# Patient Record
Sex: Female | Born: 1960 | Race: Black or African American | Hispanic: No | Marital: Single | Smoking: Never smoker
Health system: Southern US, Community
[De-identification: ages and names within clinical notes are randomized; demographics above are authoritative.]

## PROBLEM LIST (undated history)

## (undated) HISTORY — PX: APPENDECTOMY: SHX54

---

## 2015-12-22 ENCOUNTER — Encounter (HOSPITAL_BASED_OUTPATIENT_CLINIC_OR_DEPARTMENT_OTHER): Payer: Self-pay | Admitting: Emergency Medicine

## 2015-12-22 ENCOUNTER — Emergency Department (HOSPITAL_BASED_OUTPATIENT_CLINIC_OR_DEPARTMENT_OTHER): Payer: BLUE CROSS/BLUE SHIELD

## 2015-12-22 ENCOUNTER — Emergency Department (HOSPITAL_BASED_OUTPATIENT_CLINIC_OR_DEPARTMENT_OTHER)
Admission: EM | Admit: 2015-12-22 | Discharge: 2015-12-22 | Disposition: A | Discharge status: Home / Self Care | Payer: BLUE CROSS/BLUE SHIELD | Attending: Emergency Medicine | Admitting: Emergency Medicine

## 2015-12-22 DIAGNOSIS — M85472 Solitary bone cyst, left ankle and foot: Secondary | ICD-10-CM | POA: Insufficient documentation

## 2015-12-22 DIAGNOSIS — M79672 Pain in left foot: Secondary | ICD-10-CM | POA: Diagnosis present

## 2015-12-22 DIAGNOSIS — M8560 Other cyst of bone, unspecified site: Secondary | ICD-10-CM

## 2015-12-22 MED ORDER — NAPROXEN 500 MG PO TABS: 500.0000 mg | ORAL_TABLET | Freq: Two times a day (BID) | ORAL | Status: DC

## 2015-12-22 MED ORDER — TRAMADOL HCL 50 MG PO TABS: 50.0000 mg | ORAL_TABLET | Freq: Four times a day (QID) | ORAL | Status: DC | PRN

## 2015-12-22 NOTE — Discharge Instructions (Signed)
Please read and follow all provided instructions.  Your diagnoses today include:  1. Left foot pain   2. Bone cyst     Tests performed today include:  An x-ray of the affected area - does NOT show any broken bones, shows a bone cyst in the navicular bone of your foot  Vital signs. See below for your results today.   Medications prescribed:   Naproxen - anti-inflammatory pain medication  Do not exceed 500mg  naproxen every 12 hours, take with food  You have been prescribed an anti-inflammatory medication or NSAID. Take with food. Take smallest effective dose for the shortest duration needed for your pain. Stop taking if you experience stomach pain or vomiting.    Tramadol - narcotic-like pain medication  DO NOT drive or perform any activities that require you to be awake and alert because this medicine can make you drowsy.   Take any prescribed medications only as directed.  Home care instructions:   Follow any educational materials contained in this packet  Follow R.I.C.E. Protocol:  R - rest your injury   I  - use ice on injury without applying directly to skin  C - compress injury with bandage or splint  E - elevate the injury as much as possible  Follow-up instructions: Please follow-up with the provided orthopedic physician (bone specialist) if you continue to have significant pain in 1 week. In this case you may have a more severe injury that requires further care.   Return instructions:   Please return if your toes or feet are numb or tingling, appear gray or blue, or you have severe pain (also elevate the leg and loosen splint or wrap if you were given one)  Please return to the Emergency Department if you experience worsening symptoms.   Please return if you have any other emergent concerns.  Additional Information:  Your vital signs today were: BP 121/77 mmHg   Pulse 73   Temp(Src) 98.6 F (37 C) (Oral)   Resp 18   Ht 5\' 6"  (1.676 m)   Wt 53.524 kg    BMI 19.05 kg/m2   SpO2 100% If your blood pressure (BP) was elevated above 135/85 this visit, please have this repeated by your doctor within one month. -------------- If prescribed crutches for your injury: use crutches with non-weight bearing for the first few days. Then, you may walk as the pain allows, or as instructed. Start gradually with weight bearing on the affected side. Once you can walk pain free, then try jogging. When you can run forwards, then you can try moving side-to-side. If you cannot walk without crutches in one week, you need a re-check. --------------

## 2015-12-22 NOTE — ED Provider Notes (Signed)
CSN: [id number]     Arrival date & time 12/22/15  1706 History   First MD Initiated Contact with Patient 12/22/15 1741     Chief Complaint  Patient presents with  . Foot Pain     (Consider location/radiation/quality/duration/timing/severity/associated sxs/prior Treatment) HPI Comments: Patient presents with complaint of intermittent left foot pain for the past several months, more persistent and severe and constant over the past 3 days. Patient states that she dropped ice pack on her foot 6 months ago and treated this with rice treatment. No new injuries. It is difficult for her to walk and she needs to walk on her heel. She is up a lot at work. She has taken Tylenol without much relief. No ankle or leg pain. Onset acute. Nothing makes symptoms better.  Patient is a 55 y.o. female presenting with lower extremity pain. The history is provided by the patient.  Foot Pain Associated symptoms include arthralgias. Pertinent negatives include no joint swelling, neck pain, numbness or weakness.    History reviewed. No pertinent past medical history. Past Surgical History  Procedure Laterality Date  . Appendectomy     No family history on file. Social History  Substance Use Topics  . Smoking status: Never Smoker   . Smokeless tobacco: None  . Alcohol Use: 1.8 oz/week    3 Cans of beer per week   OB History    No data available     Review of Systems  Constitutional: Negative for activity change.  Musculoskeletal: Positive for arthralgias. Negative for joint swelling and neck pain.  Skin: Negative for wound.  Neurological: Negative for weakness and numbness.      Allergies  Penicillins  Home Medications   Prior to Admission medications   Medication Sig Start Date End Date Taking? Authorizing Provider  naproxen (NAPROSYN) 500 MG tablet Take 1 tablet (500 mg total) by mouth 2 (two) times daily. 12/22/15   Renne CriglerJoshua Fenna Semel, PA-C  traMADol (ULTRAM) 50 MG tablet Take 1 tablet (50 mg  total) by mouth every 6 (six) hours as needed. 12/22/15   Renne CriglerJoshua Ayden Hardwick, PA-C   BP 121/77 mmHg  Pulse 73  Temp(Src) 98.6 F (37 C) (Oral)  Resp 18  Ht 5\' 6"  (1.676 m)  Wt 53.524 kg  BMI 19.05 kg/m2  SpO2 100%  Physical Exam  Constitutional: She appears well-developed and well-nourished.  HENT:  Head: Normocephalic and atraumatic.  Eyes: Pupils are equal, round, and reactive to light.  Neck: Normal range of motion. Neck supple.  Cardiovascular: Exam reveals no decreased pulses.   Pulses:      Dorsalis pedis pulses are 2+ on the right side, and 2+ on the left side.  Musculoskeletal: She exhibits tenderness. She exhibits no edema.       Left knee: Normal.       Left ankle: Normal. No tenderness.       Left lower leg: Normal.       Left foot: There is tenderness and bony tenderness. There is normal range of motion.       Feet:  Neurological: She is alert. No sensory deficit.  Motor, sensation, and vascular distal to the injury is fully intact.   Skin: Skin is warm and dry.  Psychiatric: She has a normal mood and affect.  Nursing note and vitals reviewed.   ED Course  Procedures (including critical care time)  Imaging Review Dg Foot Complete Left  12/22/2015  CLINICAL DATA:  Heavy object dropped on the top of the  foot 6 months ago. Pain in the foot over the last 3 days. EXAM: LEFT FOOT - COMPLETE 3+ VIEW COMPARISON:  None. FINDINGS: Bony demineralization. Lisfranc joint alignment normal. Hypodense lesion along the plantar - medial navicular appears to extend to the cortical surface on the lateral projection, potentially a geode or erosion. IMPRESSION: 1. Diffuse bony demineralization. 2. Lucency along the inferior -medial margin of the navicular, 0.7 by 0.8 by 0.5 cm, probably a geode or erosion. This could be further characterized in worked up with MRI if clinically warranted. Electronically Signed   By: Gaylyn Rong M.D.   On: 12/22/2015 18:00   I have personally reviewed  and evaluated these images and lab results as part of my medical decision-making.  6:22 PM Patient seen and examined. Informed of x-ray results. Will provide with crutches, Ace wrap, NSAIDs, tramadol. Sports medicine referral given.  Vital signs reviewed and are as follows: BP 121/77 mmHg  Pulse 73  Temp(Src) 98.6 F (37 C) (Oral)  Resp 18  Ht  (1.676 m)  Wt 53.524 kg  BMI 19.05 kg/m2  SpO2 100%  Patient was counseled on RICE protocol and told to rest injury, use ice for no longer than 15 minutes every hour, compress the area, and elevate above the level of their heart as much as possible to reduce swelling. Questions answered. Patient verbalized understanding.     MDM   Final diagnoses:  Left foot pain  Bone cyst   Patients with left foot x-ray showing navicular geode or erosion, corresponding to the area of pain. Lower leg and foot are neurovascularly intact.   Renne Crigler, PA-C 12/22/15 1823  Linwood Dibbles, MD 12/22/15 [phone]

## 2015-12-22 NOTE — ED Notes (Signed)
Dropped heavy object on left foot 6 months ago but was never x-rayed.  For the past 2 months it has been hurting more intermittently and for the pat 3 days it has gotten intense.

## 2015-12-22 NOTE — ED Notes (Signed)
Pt teaching provided on medications that may cause drowsiness. Pt instructed not to drive or operate heavy machinery while taking the prescribed medication. Pt verbalized understanding.   

## 2015-12-23 ENCOUNTER — Ambulatory Visit (INDEPENDENT_AMBULATORY_CARE_PROVIDER_SITE_OTHER): Payer: BLUE CROSS/BLUE SHIELD | Admitting: Family Medicine

## 2015-12-23 ENCOUNTER — Encounter: Payer: Self-pay | Admitting: Family Medicine

## 2015-12-23 VITALS — BP 101/69 | HR 64 | Ht 66.0 in | Wt 120.0 lb

## 2015-12-23 DIAGNOSIS — M79672 Pain in left foot: Secondary | ICD-10-CM

## 2015-12-23 DIAGNOSIS — M25572 Pain in left ankle and joints of left foot: Secondary | ICD-10-CM | POA: Diagnosis not present

## 2015-12-23 MED ORDER — HYDROCODONE-ACETAMINOPHEN 5-325 MG PO TABS: 1.0000 | ORAL_TABLET | Freq: Four times a day (QID) | ORAL | Status: AC | PRN

## 2015-12-23 MED FILL — HYDROCODON-APAP 5-325: 5-325 | 7 days supply | Qty: 30 | Fill #0

## 2015-12-23 NOTE — Patient Instructions (Signed)
We will go ahead with an MRI to assess for an occult navicular fracture of your foot. Try not to put weight on this in the meantime. ACE wrap, icing, elevation, use crutches. Norco as needed for severe pain (no driving on this).

## 2015-12-24 DIAGNOSIS — M79672 Pain in left foot: Secondary | ICD-10-CM | POA: Insufficient documentation

## 2015-12-24 NOTE — Assessment & Plan Note (Signed)
following acute injury 6 months ago.  Independently reviewed radiographs and no evidence fracture.  She has a well circumscribed bony cyst in navicular bone.  I'm concerned given her inability to ambulate, swelling, focal tenderness that she may have an occult fracture and need to assess that this is a simple cyst.  Will go ahead with MRI.  In meantime ace wrap, icing, elevation, crutches, norco as needed.

## 2015-12-24 NOTE — Progress Notes (Signed)
PCP: No PCP Per Patient  Subjective:   HPI: Patient is a 55 y.o. female here for left foot pain.  Patient reports she's had 6 months of dorsomedial left foot pain. Started after she dropped an ice pack on top of her foot there. Seemed to improve then a month later worsened again. Would wax and wane. Then past 3 days has had increased swelling, severe pain in same area. Unable to bear weight. No redness, fever. Pain is throbbing and burning. Has been soakin in warm water. Using crutches. Pain level 9/10.  No past medical history on file.  Current Outpatient Prescriptions on File Prior to Visit  Medication Sig Dispense Refill  . naproxen (NAPROSYN) 500 MG tablet Take 1 tablet (500 mg total) by mouth 2 (two) times daily. 20 tablet 0   No current facility-administered medications on file prior to visit.    Past Surgical History  Procedure Laterality Date  . Appendectomy      Allergies  Allergen Reactions  . Penicillins Rash    Hives, swelling     Social History   Social History  . Marital Status: Single    Spouse Name: N/A  . Number of Children: N/A  . Years of Education: N/A   Occupational History  . Not on file.   Social History Main Topics  . Smoking status: Never Smoker   . Smokeless tobacco: Not on file  . Alcohol Use: 1.8 oz/week    3 Cans of beer per week  . Drug Use: No  . Sexual Activity: Yes    Birth Control/ Protection: None   Other Topics Concern  . Not on file   Social History Narrative    No family history on file.  BP 101/69 mmHg  Pulse 64  Ht 5\' 6"  (1.676 m)  Wt 120 lb (54.432 kg)  BMI 19.38 kg/m2  Review of Systems: See HPI above.    Objective:  Physical Exam:  Gen: NAD, comfortable in exam room  Left foot/ankle: Mild-mod swelling dorsal foot.  No redness, warmth, deformity. FROM ankle without pain. TTP over navicular, less over 1st and 2nd metatarsals. Negative ant drawer and talar tilt.   Negative syndesmotic  compression. Pain with metatarsal squeeze. Thompsons test negative. NV intact distally.  Right foot/ankle: FROM without pain.  Assessment & Plan:  1. Left foot pain - following acute injury 6 months ago.  Independently reviewed radiographs and no evidence fracture.  She has a well circumscribed bony cyst in navicular bone.  I'm concerned given her inability to ambulate, swelling, focal tenderness that she may have an occult fracture and need to assess that this is a simple cyst.  Will go ahead with MRI.  In meantime ace wrap, icing, elevation, crutches, norco as needed.

## 2015-12-27 ENCOUNTER — Ambulatory Visit (HOSPITAL_BASED_OUTPATIENT_CLINIC_OR_DEPARTMENT_OTHER)
Admission: RE | Admit: 2015-12-27 | Discharge: 2015-12-27 | Disposition: A | Discharge status: Home / Self Care | Payer: BLUE CROSS/BLUE SHIELD | Source: Ambulatory Visit | Attending: Family Medicine | Admitting: Family Medicine

## 2015-12-27 DIAGNOSIS — M25572 Pain in left ankle and joints of left foot: Secondary | ICD-10-CM | POA: Insufficient documentation

## 2015-12-27 DIAGNOSIS — M25472 Effusion, left ankle: Secondary | ICD-10-CM | POA: Diagnosis not present

## 2015-12-27 DIAGNOSIS — M899 Disorder of bone, unspecified: Secondary | ICD-10-CM | POA: Insufficient documentation

## 2015-12-29 ENCOUNTER — Telehealth: Payer: Self-pay | Admitting: Family Medicine

## 2015-12-29 NOTE — Telephone Encounter (Signed)
Spoke with patient and reviewed results.  Area within navicular doesn't have any malignant features, appears benign - reassured.  No fracture.  She does have some flexor hallucis tendinitis (clinically this is not present though) and effusion in subtalar joint.  She will come in tomorrow to get short cam walker, follow up with us in 4 weeks.  Continue with icing, nsaids.

## 2015-12-30 ENCOUNTER — Ambulatory Visit: Payer: BLUE CROSS/BLUE SHIELD | Admitting: Family Medicine

## 2015-12-30 ENCOUNTER — Encounter: Payer: Self-pay | Admitting: Family Medicine

## 2017-01-27 ENCOUNTER — Other Ambulatory Visit: Payer: Self-pay | Admitting: Orthopedic Surgery

## 2018-12-05 ENCOUNTER — Emergency Department (HOSPITAL_BASED_OUTPATIENT_CLINIC_OR_DEPARTMENT_OTHER): Payer: Self-pay

## 2018-12-05 ENCOUNTER — Other Ambulatory Visit: Payer: Self-pay

## 2018-12-05 ENCOUNTER — Encounter (HOSPITAL_BASED_OUTPATIENT_CLINIC_OR_DEPARTMENT_OTHER): Payer: Self-pay

## 2018-12-05 ENCOUNTER — Emergency Department (HOSPITAL_BASED_OUTPATIENT_CLINIC_OR_DEPARTMENT_OTHER)
Admission: EM | Admit: 2018-12-05 | Discharge: 2018-12-05 | Disposition: A | Discharge status: Home / Self Care | Payer: Self-pay | Attending: Emergency Medicine | Admitting: Emergency Medicine

## 2018-12-05 DIAGNOSIS — M5416 Radiculopathy, lumbar region: Secondary | ICD-10-CM | POA: Insufficient documentation

## 2018-12-05 DIAGNOSIS — Z79899 Other long term (current) drug therapy: Secondary | ICD-10-CM | POA: Insufficient documentation

## 2018-12-05 DIAGNOSIS — M169 Osteoarthritis of hip, unspecified: Secondary | ICD-10-CM | POA: Insufficient documentation

## 2018-12-05 DIAGNOSIS — M160 Bilateral primary osteoarthritis of hip: Secondary | ICD-10-CM

## 2018-12-05 LAB — URINALYSIS, ROUTINE W REFLEX MICROSCOPIC
Bilirubin Urine: NEGATIVE
Glucose, UA: NEGATIVE mg/dL
Hgb urine dipstick: NEGATIVE
Ketones, ur: NEGATIVE mg/dL
Leukocytes,Ua: NEGATIVE
Nitrite: NEGATIVE
Protein, ur: NEGATIVE mg/dL
Specific Gravity, Urine: <1.005 — ABNORMAL LOW (ref 1.005–1.030)
pH: 6 (ref 5.0–8.0)

## 2018-12-05 MED ORDER — CYCLOBENZAPRINE HCL 10 MG PO TABS: 10.0000 mg | ORAL_TABLET | Freq: Two times a day (BID) | ORAL | 0 refills | Status: AC | PRN

## 2018-12-05 MED ORDER — KETOROLAC TROMETHAMINE 60 MG/2ML IM SOLN
60.0000 mg | Freq: Once | INTRAMUSCULAR | Status: AC
  Administered 2018-12-05: 60 mg via INTRAMUSCULAR
  Filled 2018-12-05: qty 2

## 2018-12-05 MED ORDER — NAPROXEN 500 MG PO TABS: 500.0000 mg | ORAL_TABLET | Freq: Two times a day (BID) | ORAL | 0 refills | Status: AC

## 2018-12-05 MED ORDER — METHYLPREDNISOLONE 4 MG PO TBPK: ORAL_TABLET | ORAL | 0 refills | Status: AC

## 2018-12-05 MED FILL — NAPROXEN 500 MG TABLET: 500 | 15 days supply | Qty: 30 | Fill #0

## 2018-12-05 MED FILL — CYCLOBENZAPRINE HCL 10 MG T: 10 | 10 days supply | Qty: 20 | Fill #0

## 2018-12-05 MED FILL — METHYLPREDNISOLONE 4 MG TBP: 4 | 6 days supply | Qty: 21 | Fill #0

## 2018-12-05 NOTE — ED Notes (Signed)
ED Provider at bedside. 

## 2018-12-05 NOTE — ED Notes (Signed)
c /o left flank and upper leg pain x 2 weeks  States does a lot of lifting pt  Pain increased w movement

## 2018-12-05 NOTE — Discharge Instructions (Signed)
In addition to taking the naproxen 2 times a day you can also take 2 extra strength Tylenol with the naproxen for additional pain control.

## 2018-12-05 NOTE — ED Triage Notes (Signed)
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

## 2018-12-05 NOTE — ED Provider Notes (Signed)
[facility] EMERGENCY DEPARTMENT Provider Note   CSN: [id number] Arrival date & time: 12/05/18  1452    History   Chief Complaint Chief Complaint  Patient presents with   Back Pain    HPI Jacqueline Villegas is a 58 y.o. female.     Patient is a 58 year old female with no significant medical history except for carpal tunnel presenting today with 2-1/2 weeks of worsening left-sided back pain and bilateral groin pain.  Patient states about 2-1/2 weeks ago she started noticing some discomfort in her left lower back as well as in her groin.  This pain has gradually worsened over the last 2-1/2 weeks to where in the last 24 hours she is having trouble even standing up and attempting to walk.  The pain when walking is a 9 out of 10 sharp in nature which it stays in bilateral groin area.  The pain is relieved if she is laying down and not putting weight on her legs.  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  She denies any trauma, prior surgeries to the back.  She is having no difficulty with with her bowel or bladder.  No fever, cough, vomiting or abdominal pain.  The history is provided by the patient.    History reviewed. No pertinent past medical history.  Patient Active Problem List   Diagnosis Date Noted   Left foot pain 12/24/2015    Past Surgical History:  Procedure Laterality Date   APPENDECTOMY       OB History   No obstetric history on file.      Home Medications    Prior to Admission medications   Medication Sig Start Date End Date Taking? Authorizing Provider  HYDROcodone-acetaminophen (NORCO) 5-325 MG tablet Take 1 tablet by mouth every 6 (six) hours as needed for moderate pain. 12/23/15   Hudnall, Azucena FallenShane R, MD  naproxen (NAPROSYN) 500 MG tablet Take 1 tablet (500 mg total) by mouth 2 (two) times daily. 12/22/15   Renne CriglerGeiple, Joshua, PA-C    Family History No family history on file.  Social  History Social History   Tobacco Use   Smoking status: Never Smoker   Smokeless tobacco: Never Used  Substance Use Topics   Alcohol use: Yes    Comment: weekly   Drug use: No     Allergies   Penicillins   Review of Systems Review of Systems  All other systems reviewed and are negative.    Physical Exam Updated Vital Signs BP 128/86 (BP Location: Right Arm)    Pulse 80    Temp 98.4 F (36.9 C) (Oral)    Resp 16    Ht 5\' 6"  (1.676 m)    Wt 59.9 kg    SpO2 98%    BMI 21.31 kg/m   Physical Exam Vitals signs and nursing note reviewed.  Constitutional:      General: She is not in acute distress.    Appearance: She is well-developed and normal weight.  HENT:     Head: Normocephalic and atraumatic.  Eyes:     Pupils: Pupils are equal, round, and reactive to light.  Cardiovascular:     Rate and Rhythm: Normal rate and regular rhythm.     Heart sounds: Normal heart sounds. No murmur. No friction rub.  Pulmonary:     Effort: Pulmonary effort is normal.     Breath sounds: Normal breath sounds. No wheezing or  rales.  Abdominal:     General: Bowel sounds are normal. There is no distension.     Palpations: Abdomen is soft.     Tenderness: There is no abdominal tenderness. There is no guarding or rebound.  Musculoskeletal:        General: Tenderness present.     Right hip: She exhibits decreased range of motion, tenderness and bony tenderness.     Left hip: She exhibits decreased range of motion, tenderness and bony tenderness.       Legs:     Comments: No edema/  Left lateral back pain to palpation.  No pain in the sciatic notch.  Pain with palpation of pelvic girdle and groin bilaterally.  No SI joint pain.  Skin:    General: Skin is warm and dry.     Findings: No rash.  Neurological:     Mental Status: She is alert and oriented to person, place, and time.     Cranial Nerves: No cranial nerve deficit.     Comments: Difficulty going from sitting to standing and some  weakness in the bilateral hip flexors.  No foot drop or leg drag.  5/5 strength in plantar/dorsiflexion of the feet bilaterally.  5/5 hamstring bilaterally, 5/5 calf bilaterally.  Sensation intact.  Psychiatric:        Mood and Affect: Mood normal.        Behavior: Behavior normal.        Thought Content: Thought content normal.      ED Treatments / Results  Labs (all labs ordered are listed, but only abnormal results are displayed) Labs Reviewed  URINALYSIS, ROUTINE W REFLEX MICROSCOPIC - Abnormal; Notable for the following components:      Result Value   Specific Gravity, Urine <1.005 (*)    All other components within normal limits    EKG None  Radiology Dg Lumbar Spine Complete  Result Date: 12/05/2018 CLINICAL DATA:  Lumbago for approximately 2 weeks EXAM: LUMBAR SPINE - COMPLETE 4+ VIEW COMPARISON:  None. FINDINGS: Frontal, lateral, spot lumbosacral lateral, and bilateral oblique views were obtained. There are 5 non-rib-bearing lumbar type vertebral bodies. There is no fracture or spondylolisthesis. Disc spaces appear unremarkable. There is facet osteoarthritic change at L5-S1 bilaterally. 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 No appreciable disc space narrowing. No fracture or spondylolisthesis. Electronically Signed   By: Bretta Bang III M.D.   On: 12/05/2018 15:52   Dg Pelvis 1-2 Views  Result Date: 12/05/2018 CLINICAL DATA:  Pain EXAM: PELVIS - 1-2 VIEW COMPARISON:  None. FINDINGS: There is no evident fracture or dislocation. There is moderate symmetric narrowing of each hip joint. No erosive change. Sacroiliac joints appear symmetric and unremarkable bilaterally. IMPRESSION: Moderate symmetric osteoarthritic change in each hip joint. No fracture or dislocation. Electronically Signed   By: Bretta Bang III M.D.   On: 12/05/2018 15:51    Procedures Procedures (including critical care time)  Medications Ordered in ED Medications    ketorolac (TORADOL) injection 60 mg (has no administration in time range)     Initial Impression / Assessment and Plan / ED Course  I have reviewed the triage vital signs and the nursing notes.  Pertinent labs & imaging results that were available during my care of the patient were reviewed by me and considered in my medical decision making (see chart for details).       Patient is a 58 year old female presenting today with 2-1/2 weeks of worsening back and groin pain.  Pain is bilaterally in the groin and hips but only pain in the left lateral back.  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  She has no prior history of back or leg problems.  She has no pain that goes down the legs or to the feet.  She denies any paresthesias, bowel or bladder issues.  She has no fevers, infectious complaints and no prior back surgery.  She is otherwise healthy and routinely follows up with a doctor.  She has been taking 200 mg of ibuprofen every 4-5 hours as needed for the pain and using heating pads.  With lying down and being still her pain is significantly improved but when attempting to walk she has significant trouble.  On exam she has 4 out of 5 strength in her bilateral hip flexors which she attributes to the pain being too severe.  Rest of muscle strength is intact.  Sensation is intact.  She has no pain over the SI joints concerning for sacroiliitis.  She does have other issues with arthritis but no history of rheumatoid arthritis or other arthropathies.  These do not run in her family either.  Given the bilateral nature of her symptoms and the pain in her left lower back concern for radiculopathy in L2.  We will do plain films of the lumbar spine and pelvis to ensure no other acute issues.  Patient given a dose of Toradol.  UA without acute findings and patient denies any urinary symptoms.  He has no signs of cauda equina  today. X-rays today show diffuse arthritis but no acute findings.  Will treat with NSAIDs, prednisone and muscle relaxer.  Will give f/u with sports med and restrictions at work.  Final Clinical Impressions(s) / ED Diagnoses   Final diagnoses:  Bilateral hip joint arthritis  Radiculopathy of lumbar region    ED Discharge Orders         Ordered    naproxen (NAPROSYN) 500 MG tablet  2 times daily     12/05/18 1607    methylPREDNISolone (MEDROL DOSEPAK) 4 MG TBPK tablet     12/05/18 1607    cyclobenzaprine (FLEXERIL) 10 MG tablet  2 times daily PRN     12/05/18 1607           Gwyneth Sprout, MD 12/05/18 1607

## 2018-12-07 ENCOUNTER — Encounter: Payer: Self-pay | Admitting: Family Medicine

## 2018-12-07 ENCOUNTER — Ambulatory Visit (INDEPENDENT_AMBULATORY_CARE_PROVIDER_SITE_OTHER): Payer: Self-pay | Admitting: Family Medicine

## 2018-12-07 ENCOUNTER — Other Ambulatory Visit: Payer: Self-pay

## 2018-12-07 VITALS — BP 89/63 | HR 69 | Ht 66.0 in | Wt 132.0 lb

## 2018-12-07 DIAGNOSIS — M25551 Pain in right hip: Secondary | ICD-10-CM

## 2018-12-07 DIAGNOSIS — M25552 Pain in left hip: Secondary | ICD-10-CM

## 2018-12-07 DIAGNOSIS — M545 Low back pain, unspecified: Secondary | ICD-10-CM

## 2018-12-07 NOTE — Progress Notes (Signed)
Jacqueline Villegas - 58 y.o. female MRN [id number]  Date of birth: [birth year]  SUBJECTIVE:  Including CC & ROS.  Chief Complaint  Patient presents with  . Back Pain    right-sided low back    Jacqueline Villegas is a 58 y.o. female that is presenting with left-sided lower back pain and bilateral groin pain.  She works as a Agricultural engineer.  The pain is severe and constant.  She is having to use a wheelchair for ambulation.  She has pain worse with bending over and picking up heavy things.  The pain is sharp and sudden stabbing.  She denies any radicular symptoms.  The pain is been ongoing for little over 2 weeks.  Denies any inciting event or trauma.  No prior history of surgery.  Was seen in the emergency room was provided Toradol and prednisone..  Independent review of the pelvis x-ray shows some degenerative changes of each hip as well as pincer lesions.  Independent review of the lumbar films from 5/12 shows degenerative facet disease in the lower lumbar spine.   Review of Systems  Constitutional: Negative for fever.  HENT: Negative for congestion.   Respiratory: Negative for cough.   Cardiovascular: Negative for chest pain.  Gastrointestinal: Negative for abdominal pain.  Musculoskeletal: Positive for arthralgias and back pain.  Skin: Negative for color change.  Neurological: Negative for weakness.  Hematological: Negative for adenopathy.    HISTORY: Past Medical, Surgical, Social, and Family History Reviewed & Updated per EMR.   Pertinent Historical Findings include:  No past medical history on file.  Past Surgical History:  Procedure Laterality Date  . APPENDECTOMY      Allergies  Allergen Reactions  . Penicillins Rash    Hives, swelling     No family history on file.   Social History   Socioeconomic History  . Marital status: Single    Spouse name: Not on file  . Number of children: Not on file  . Years of education: Not on file  . Highest education level: Not  on file  Occupational History  . Not on file  Social Needs  . Financial resource strain: Not on file  . Food insecurity:    Worry: Not on file    Inability: Not on file  . Transportation needs:    Medical: Not on file    Non-medical: Not on file  Tobacco Use  . Smoking status: Never Smoker  . Smokeless tobacco: Never Used  Substance and Sexual Activity  . Alcohol use: Yes    Comment: weekly  . Drug use: No  . Sexual activity: Not on file  Lifestyle  . Physical activity:    Days per week: Not on file    Minutes per session: Not on file  . Stress: Not on file  Relationships  . Social connections:    Talks on phone: Not on file    Gets together: Not on file    Attends religious service: Not on file    Active member of club or organization: Not on file    Attends meetings of clubs or organizations: Not on file    Relationship status: Not on file  . Intimate partner violence:    Fear of current or ex partner: Not on file    Emotionally abused: Not on file    Physically abused: Not on file    Forced sexual activity: Not on file  Other Topics Concern  . Not on file  Social History Narrative  .  Not on file     PHYSICAL EXAM:  VS: BP (!) 89/63   Pulse 69   Ht 5\' 6"  (1.676 m)   Wt 132 lb (59.9 kg)   BMI 21.31 kg/m  Physical Exam Gen: NAD, alert, cooperative with exam, well-appearing ENT: normal lips, normal nasal mucosa,  Eye: normal EOM, normal conjunctiva and lids CV:  no edema, +2 pedal pulses   Resp: no accessory muscle use, non-labored,  Skin: no rashes, no areas of induration  Neuro: normal tone, normal sensation to touch Psych:  normal insight, alert and oriented MSK:  Right and left hip: No tenderness to palpation of the ASIS. Normal internal and external rotation of the hips. Normal strength resistance with hip flexion. No tenderness palpation of the greater trochanter. Normal strength resistance with knee flexion and extension and plantarflexion of  dorsiflexion. Some pain with internal rotation bilaterally. Symmetric reflexes at the patella. Negative straight leg raise bilaterally. Back: Some tenderness palpation over the left-sided lower paraspinal muscles. No tenderness palpation of the SI joint. Neurovascular intact     ASSESSMENT & PLAN:   Bilateral hip pain She is describing pain in each groin which would suggest that this is coming from the joint.  X-ray does show some degenerative changes but has good range of motion on exam.  Less likely for nerve impingement.  Has good strength with abductors -We will continue with the prednisone. -Counseled on home exercise therapy and supportive care. -Provided work note for accommodations for 2 weeks. -If no improvement can consider injection into the joint and consider individual imaging per hip.  Acute left-sided low back pain without sciatica She does have degenerative change of the facet joints in the lower lumbar spine.  This could be associated with the facet arthritis.  Does not seem to be related to any nerve impingement at this time. -Work on range of motion and core strengthening. -Could consider referral to physical therapy if no improvement.

## 2018-12-07 NOTE — Assessment & Plan Note (Signed)
She is describing pain in each groin which would suggest that this is coming from the joint.  X-ray does show some degenerative changes but has good range of motion on exam.  Less likely for nerve impingement.  Has good strength with abductors -We will continue with the prednisone. -Counseled on home exercise therapy and supportive care. -Provided work note for accommodations for 2 weeks. -If no improvement can consider injection into the joint and consider individual imaging per hip.

## 2018-12-07 NOTE — Assessment & Plan Note (Signed)
She does have degenerative change of the facet joints in the lower lumbar spine.  This could be associated with the facet arthritis.  Does not seem to be related to any nerve impingement at this time. -Work on range of motion and core strengthening. -Could consider referral to physical therapy if no improvement.

## 2018-12-07 NOTE — Patient Instructions (Signed)
Nice to meet you  Please try the gentle range of motion exercises  Please finished the prednisone Please send me a message in MyChart with any questions or updates.  Please see me back in 2 weeks.   --Dr. Jordan Likes

## 2018-12-21 ENCOUNTER — Telehealth: Payer: Self-pay | Admitting: Family Medicine

## 2018-12-21 ENCOUNTER — Other Ambulatory Visit: Payer: Self-pay

## 2018-12-21 ENCOUNTER — Ambulatory Visit (INDEPENDENT_AMBULATORY_CARE_PROVIDER_SITE_OTHER): Payer: Self-pay | Admitting: Family Medicine

## 2018-12-21 ENCOUNTER — Encounter: Payer: Self-pay | Admitting: Family Medicine

## 2018-12-21 ENCOUNTER — Ambulatory Visit: Payer: Self-pay

## 2018-12-21 ENCOUNTER — Ambulatory Visit (HOSPITAL_BASED_OUTPATIENT_CLINIC_OR_DEPARTMENT_OTHER)
Admission: RE | Admit: 2018-12-21 | Discharge: 2018-12-21 | Disposition: A | Discharge status: Home / Self Care | Payer: Self-pay | Source: Ambulatory Visit | Attending: Family Medicine | Admitting: Family Medicine

## 2018-12-21 VITALS — Ht 66.0 in | Wt 134.0 lb

## 2018-12-21 DIAGNOSIS — M25552 Pain in left hip: Secondary | ICD-10-CM

## 2018-12-21 DIAGNOSIS — M25551 Pain in right hip: Secondary | ICD-10-CM

## 2018-12-21 MED ORDER — METHYLPREDNISOLONE ACETATE 40 MG/ML IJ SUSP
40.0000 mg | Freq: Once | INTRAMUSCULAR | Status: AC
  Administered 2018-12-21: 40 mg via INTRA_ARTICULAR

## 2018-12-21 NOTE — Patient Instructions (Signed)
Good to see you Take tylenol 650 mg three times a day is the best evidence based medicine we have for arthritis.  Glucosamine sulfate 750mg  twice a day is a supplement that has been shown to help moderate to severe arthritis. Vitamin D 2000 IU daily Fish oil 2 grams daily.  Tumeric 500mg  twice daily.  Capsaicin topically up to four times a day may also help with pain.  I will call you with the results from today Please send me a message in MyChart with any questions or updates.  Please see me back in 4 weeks.   --Dr. Jordan Likes

## 2018-12-21 NOTE — Assessment & Plan Note (Signed)
Seems to still be related to the joint as most of her pain is in the inguinal region. -Left hip joint injection today.  If improvement can consider one on the right side. -Unilateral left hip x-ray. -Provided a work note with restrictions for the next 2 weeks.  We will follow-up in 2 weeks can consider right hip injection at that time.  Could consider advanced to physical therapy.

## 2018-12-21 NOTE — Telephone Encounter (Signed)
Left VM for patient. If she calls back please have her speak with a nurse/CMA and inform that her xray shows mild degenerative changes of the joint. The [facility] can report results to patient.   If any questions then please take the best time and phone number to call and I will try to call her back.   Myra Rude, MD [facility] 12/21/2018, 3:28 PM

## 2018-12-21 NOTE — Progress Notes (Signed)
Jacqueline Villegas - 58 y.o. female MRN [id number]  Date of birth: [birth year]  SUBJECTIVE:  Including CC & ROS.  Chief Complaint  Patient presents with  . Follow-up    follow up for left hip    Jacqueline Villegas is a 58 y.o. female that is following up for her ongoing hip pain.  She is having pain over the left and right inguinal region.  The pain is worse with walking.  The pain is 10 out of 10 and constant.  She had limited to no improvement with the prednisone.  She was also prescribed Norco, naproxen and Flexeril.  The pain is localized to this area.  She has been back to work with restrictions.  The pain is sharp and stabbing.  Review of Systems  Constitutional: Negative for fever.  HENT: Negative for congestion.   Respiratory: Negative for cough.   Cardiovascular: Negative for chest pain.  Gastrointestinal: Negative for abdominal distention.  Musculoskeletal: Positive for gait problem.  Neurological: Negative for weakness.  Hematological: Negative for adenopathy.    HISTORY: Past Medical, Surgical, Social, and Family History Reviewed & Updated per EMR.   Pertinent Historical Findings include:  No past medical history on file.  Past Surgical History:  Procedure Laterality Date  . APPENDECTOMY      Allergies  Allergen Reactions  . Penicillins Rash    Hives, swelling     No family history on file.   Social History   Socioeconomic History  . Marital status: Single    Spouse name: Not on file  . Number of children: Not on file  . Years of education: Not on file  . Highest education level: Not on file  Occupational History  . Not on file  Social Needs  . Financial resource strain: Not on file  . Food insecurity:    Worry: Not on file    Inability: Not on file  . Transportation needs:    Medical: Not on file    Non-medical: Not on file  Tobacco Use  . Smoking status: Never Smoker  . Smokeless tobacco: Never Used  Substance and Sexual Activity  . Alcohol use:  Yes    Comment: weekly  . Drug use: No  . Sexual activity: Not on file  Lifestyle  . Physical activity:    Days per week: Not on file    Minutes per session: Not on file  . Stress: Not on file  Relationships  . Social connections:    Talks on phone: Not on file    Gets together: Not on file    Attends religious service: Not on file    Active member of club or organization: Not on file    Attends meetings of clubs or organizations: Not on file    Relationship status: Not on file  . Intimate partner violence:    Fear of current or ex partner: Not on file    Emotionally abused: Not on file    Physically abused: Not on file    Forced sexual activity: Not on file  Other Topics Concern  . Not on file  Social History Narrative  . Not on file     PHYSICAL EXAM:  VS: Ht 5\' 6"  (1.676 m)   Wt 134 lb (60.8 kg)   BMI 21.63 kg/m  Physical Exam Gen: NAD, alert, cooperative with exam, well-appearing ENT: normal lips, normal nasal mucosa,  Eye: normal EOM, normal conjunctiva and lids CV:  no edema, +2 pedal pulses  Resp: no accessory muscle use, non-labored,  Skin: no rashes, no areas of induration  Neuro: normal tone, normal sensation to touch Psych:  normal insight, alert and oriented MSK:  Left and right hip: No tenderness palpation over the greater trochanter. No tenderness palpation of the SI joint. Symptoms to palpation in the left lower paraspinal muscle. Some weakness with hip flexion bilaterally. Normal knee flexion extension strength resistance. Normal internal and external rotation of the hip. Neurovascular intact   Aspiration/Injection Procedure Note Jacqueline Villegas  Procedure: Injection Indications: Left hip pain  Procedure Details Consent: Risks of procedure as well as the alternatives and risks of each were explained to the (patient/caregiver).  Consent for procedure obtained. Time Out: Verified patient identification, verified procedure,  site/side was marked, verified correct patient position, special equipment/implants available, medications/allergies/relevent history reviewed, required imaging and test results available.  Performed.  The area was cleaned with iodine and alcohol swabs.    The left hip joint was injected using 1 cc's of 40 mg Depo-Medrol and 4 cc's of 0.5% bupivacaine with a 22 3 1/2" needle.  Ultrasound was used. Images were obtained in trans views showing the injection.     A sterile dressing was applied.  Patient did tolerate procedure well.       ASSESSMENT & PLAN:   Bilateral hip pain Seems to still be related to the joint as most of her pain is in the inguinal region. -Left hip joint injection today.  If improvement can consider one on the right side. -Unilateral left hip x-ray. -Provided a work note with restrictions for the next 2 weeks.  We will follow-up in 2 weeks can consider right hip injection at that time.  Could consider advanced to physical therapy.

## 2019-01-02 ENCOUNTER — Telehealth: Payer: Self-pay | Admitting: Family Medicine

## 2019-01-02 MED ORDER — PREDNISONE 5 MG PO TABS: ORAL_TABLET | ORAL | 0 refills | Status: AC

## 2019-01-02 MED FILL — predniSONE 5 MG TABS: 5 | 6 days supply | Qty: 21 | Fill #0

## 2019-01-02 NOTE — Telephone Encounter (Signed)
Spoke with patient about her pain. Will send in prednisone. May need to consider gabapentin. Had no improvement with hip injection so joint is unlikely the source. May need to consider her symptoms being radicular in nature. May need to obtain some labs if suspecting gout, RA, etc.   Rosemarie Ax, MD [facility] 01/02/2019, 3:58 PM

## 2019-01-02 NOTE — Telephone Encounter (Signed)
Patient called stating she is in pain and can barley walk. She did not have any relief from the injections.   Patient is requesting a refill of methylprednisolone. She says that helped her pain.   Pharmacy: [facility] [place]

## 2019-01-18 ENCOUNTER — Ambulatory Visit: Payer: Self-pay | Admitting: Family Medicine

## 2019-01-18 NOTE — Progress Notes (Deleted)
  Jacqueline Villegas - 58 y.o. female MRN [id number]  Date of birth: Mar 24, 1961  SUBJECTIVE:  Including CC & ROS.  No chief complaint on file.   Jacqueline Villegas is a 58 y.o. female that is  ***.  ***   Review of Systems  HISTORY: Past Medical, Surgical, Social, and Family History Reviewed & Updated per EMR.   Pertinent Historical Findings include:  No past medical history on file.  Past Surgical History:  Procedure Laterality Date  . APPENDECTOMY      Allergies  Allergen Reactions  . Penicillins Rash    Hives, swelling     No family history on file.   Social History   Socioeconomic History  . Marital status: Single    Spouse name: Not on file  . Number of children: Not on file  . Years of education: Not on file  . Highest education level: Not on file  Occupational History  . Not on file  Social Needs  . Financial resource strain: Not on file  . Food insecurity    Worry: Not on file    Inability: Not on file  . Transportation needs    Medical: Not on file    Non-medical: Not on file  Tobacco Use  . Smoking status: Never Smoker  . Smokeless tobacco: Never Used  Substance and Sexual Activity  . Alcohol use: Yes    Comment: weekly  . Drug use: No  . Sexual activity: Not on file  Lifestyle  . Physical activity    Days per week: Not on file    Minutes per session: Not on file  . Stress: Not on file  Relationships  . Social Herbalist on phone: Not on file    Gets together: Not on file    Attends religious service: Not on file    Active member of club or organization: Not on file    Attends meetings of clubs or organizations: Not on file    Relationship status: Not on file  . Intimate partner violence    Fear of current or ex partner: Not on file    Emotionally abused: Not on file    Physically abused: Not on file    Forced sexual activity: Not on file  Other Topics Concern  . Not on file  Social History Narrative  . Not on file      PHYSICAL EXAM:  VS: There were no vitals taken for this visit. Physical Exam Gen: NAD, alert, cooperative with exam, well-appearing ENT: normal lips, normal nasal mucosa,  Eye: normal EOM, normal conjunctiva and lids CV:  no edema, +2 pedal pulses   Resp: no accessory muscle use, non-labored,  GI: no masses or tenderness, no hernia  Skin: no rashes, no areas of induration  Neuro: normal tone, normal sensation to touch Psych:  normal insight, alert and oriented MSK:  ***      ASSESSMENT & PLAN:   No problem-specific Assessment & Plan notes found for this encounter.

## 2020-08-08 IMAGING — CR LUMBAR SPINE - COMPLETE 4+ VIEW
5 series · 5 of 5 positions shown · non-contrast
Comparison: None.

CLINICAL DATA: Lumbago for approximately 2 weeks

EXAM:
LUMBAR SPINE - COMPLETE 4+ VIEW

[t l-spine a.p.]
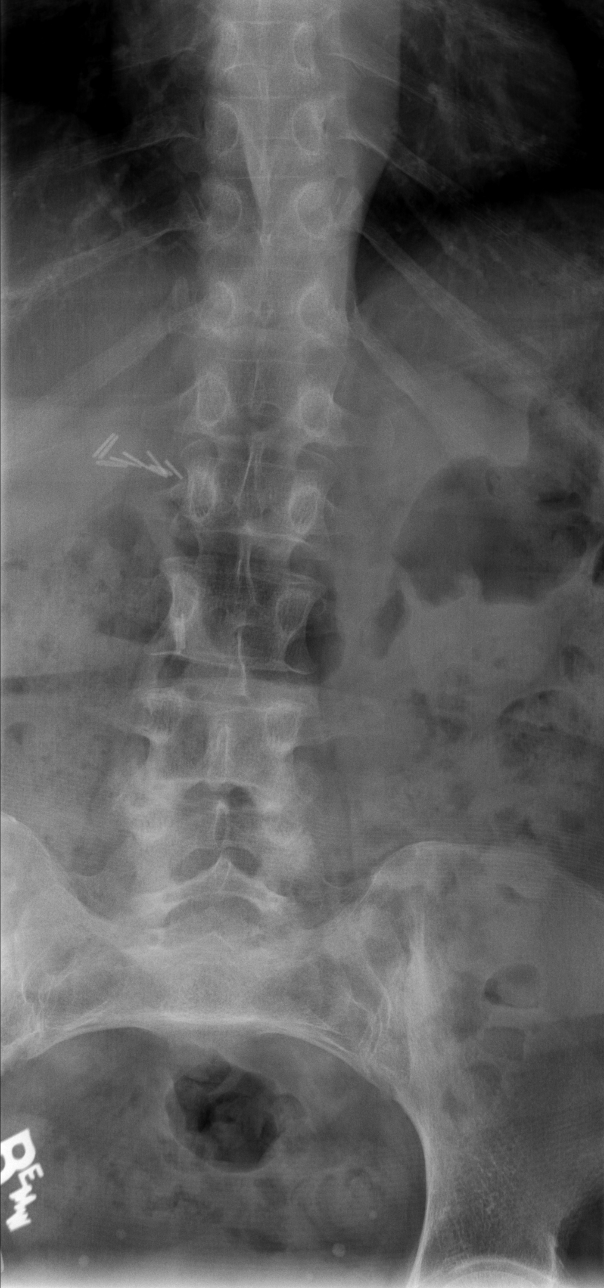

[t l-spine oblique exposure (1 of 2)]
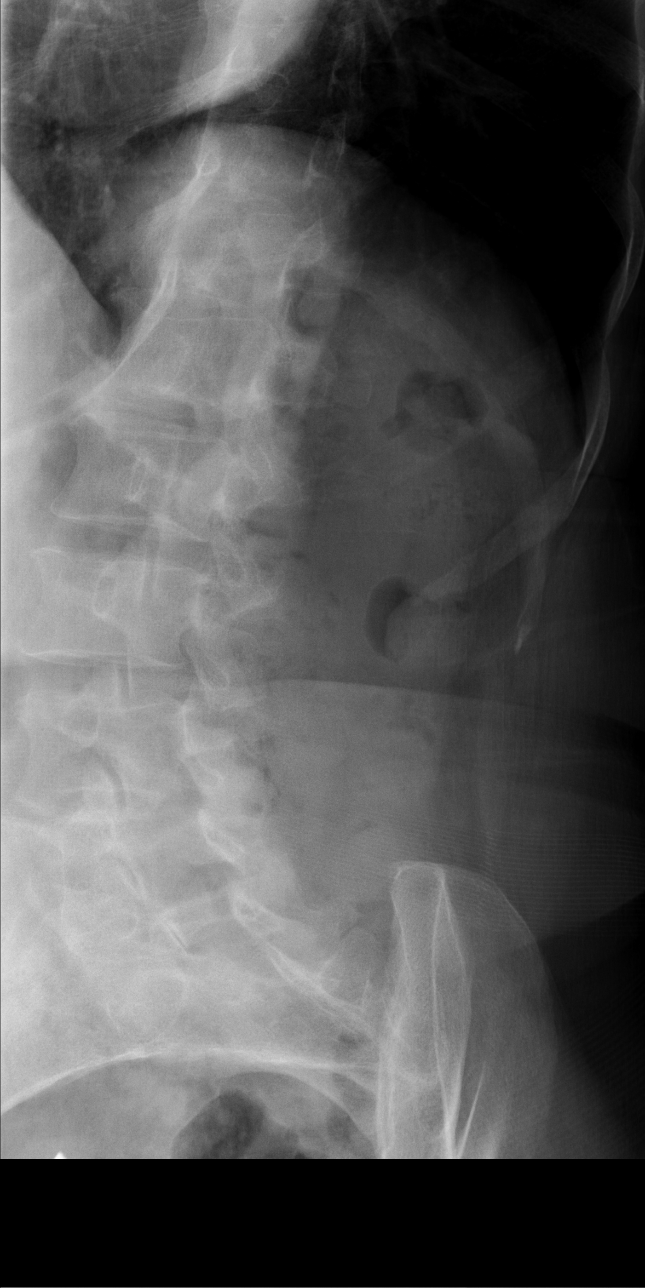

[t l-spine oblique exposure (2 of 2)]
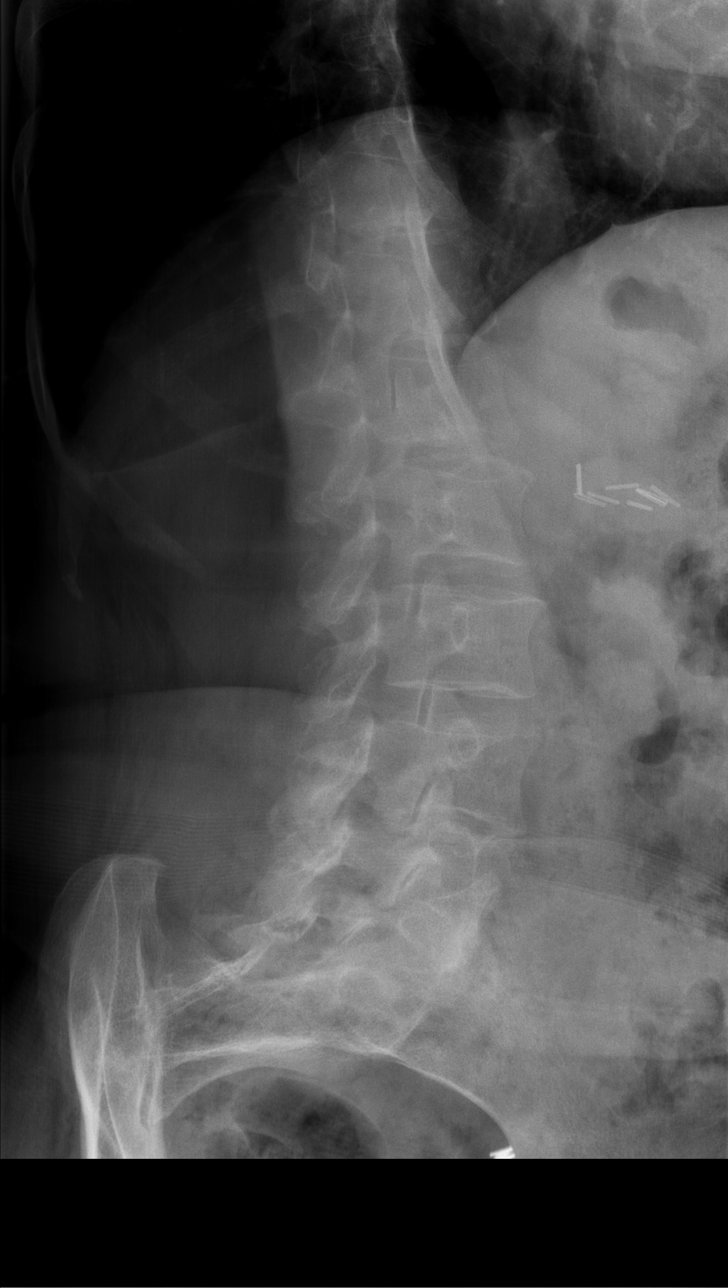

[t l-spine lat]
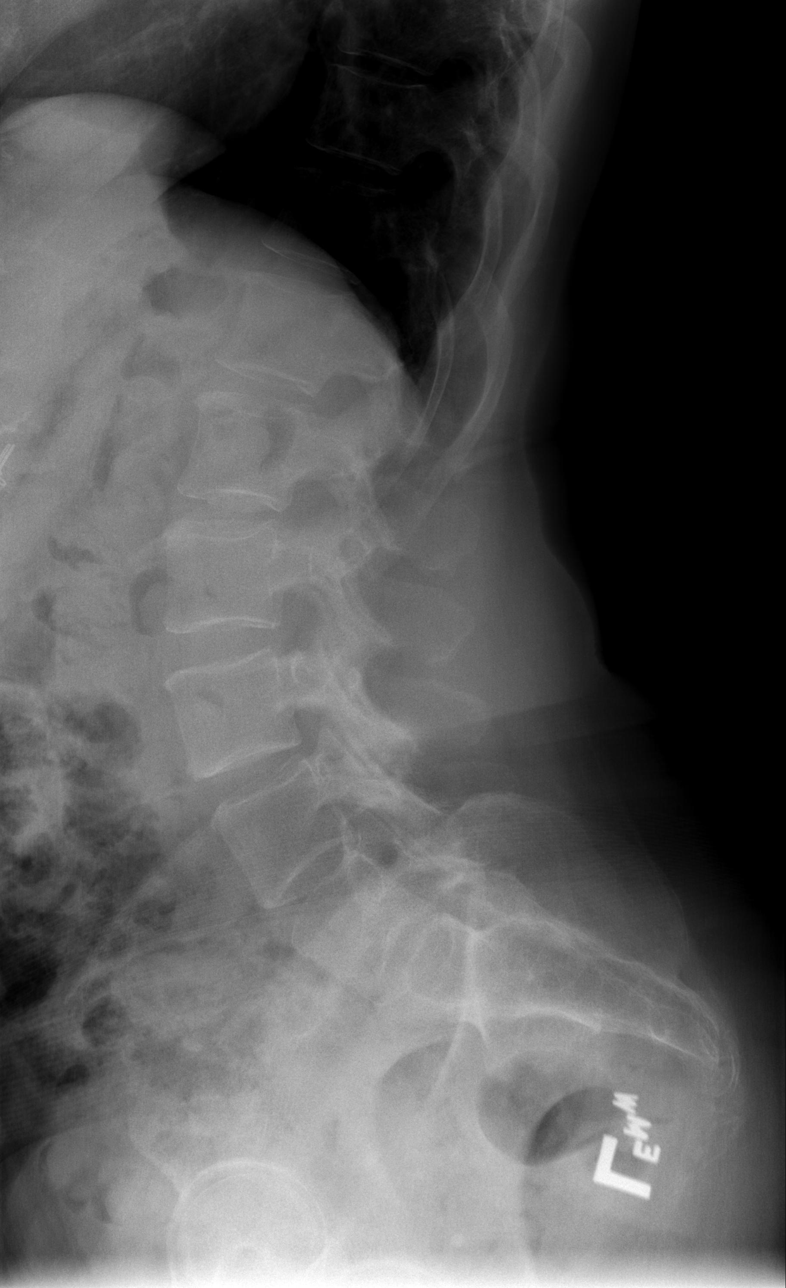

[t l-spine l5-s1 spot]
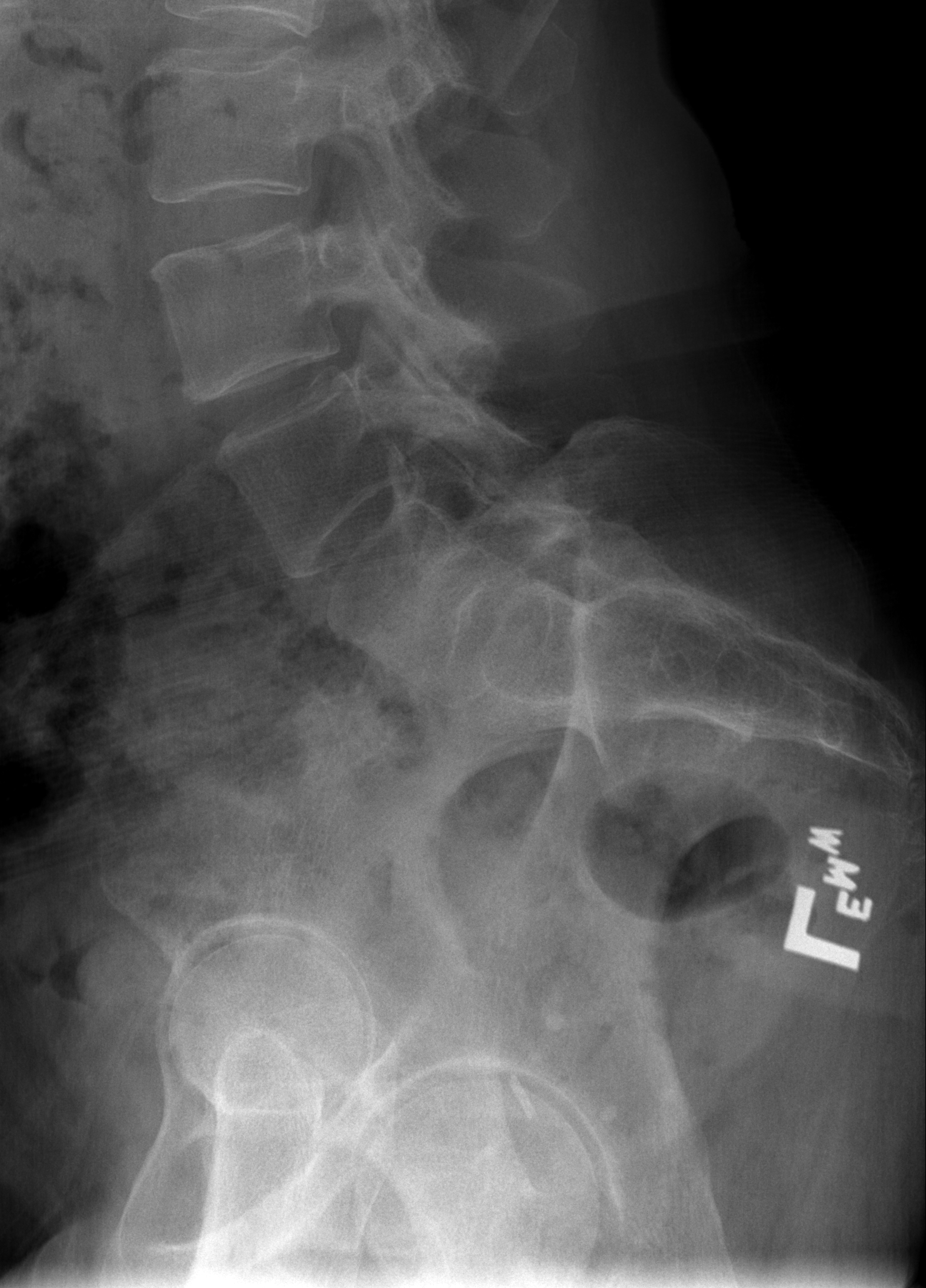

[5 of 5 positions shown; findings below may reference images not displayed]

FINDINGS: Frontal, lateral, spot lumbosacral lateral, and bilateral oblique
views were obtained. There are 5 non-rib-bearing lumbar type
vertebral bodies. There is no fracture or spondylolisthesis. Disc
spaces appear unremarkable. There is facet osteoarthritic change at
L5-S1 bilaterally.
IMPRESSION: Facet osteoarthritic change at L5-S1 bilaterally. No appreciable
disc space narrowing. No fracture or spondylolisthesis.

## 2022-11-11 ENCOUNTER — Encounter: Payer: Self-pay | Admitting: *Deleted
